# Patient Record
Sex: Female | Born: 1975 | Race: Black or African American | Hispanic: No | Marital: Single | Smoking: Current every day smoker
Health system: Southern US, Community
[De-identification: ages and names within clinical notes are randomized; demographics above are authoritative.]

---

## 2008-12-17 ENCOUNTER — Emergency Department (HOSPITAL_COMMUNITY): Admission: EM | Admit: 2008-12-17 | Discharge: 2008-12-17 | Payer: Self-pay | Admitting: Family Medicine

## 2013-04-28 ENCOUNTER — Emergency Department (HOSPITAL_BASED_OUTPATIENT_CLINIC_OR_DEPARTMENT_OTHER)
Admission: EM | Admit: 2013-04-28 | Discharge: 2013-04-28 | Disposition: A | Discharge status: Home / Self Care | Payer: Self-pay | Attending: Emergency Medicine | Admitting: Emergency Medicine

## 2013-04-28 ENCOUNTER — Encounter (HOSPITAL_BASED_OUTPATIENT_CLINIC_OR_DEPARTMENT_OTHER): Payer: Self-pay | Admitting: Emergency Medicine

## 2013-04-28 ENCOUNTER — Emergency Department (HOSPITAL_BASED_OUTPATIENT_CLINIC_OR_DEPARTMENT_OTHER): Payer: Self-pay

## 2013-04-28 DIAGNOSIS — F17200 Nicotine dependence, unspecified, uncomplicated: Secondary | ICD-10-CM | POA: Insufficient documentation

## 2013-04-28 DIAGNOSIS — M549 Dorsalgia, unspecified: Secondary | ICD-10-CM | POA: Insufficient documentation

## 2013-04-28 MED ORDER — HYDROCODONE-ACETAMINOPHEN 5-325 MG PO TABS: 2.0000 | ORAL_TABLET | ORAL | Status: AC | PRN

## 2013-04-28 MED ORDER — METHOCARBAMOL 500 MG PO TABS: 500.0000 mg | ORAL_TABLET | Freq: Two times a day (BID) | ORAL | Status: AC

## 2013-04-28 MED ORDER — IBUPROFEN 800 MG PO TABS: 800.0000 mg | ORAL_TABLET | Freq: Three times a day (TID) | ORAL | Status: AC

## 2013-04-28 NOTE — ED Notes (Signed)
Patient asked to change into gown. 

## 2013-04-28 NOTE — ED Provider Notes (Signed)
CSN: [id number]     Arrival date & time 04/28/13  1618 History   First MD Initiated Contact with Patient 04/28/13 1820     Chief Complaint  Patient presents with  . Back Pain     (Consider location/radiation/quality/duration/timing/severity/associated sxs/prior Treatment) Patient is a 38 y.o. female presenting with back pain. The history is provided by the patient. No language interpreter was used.  Back Pain Location:  Thoracic spine Quality:  Aching Radiates to:  Does not radiate Pain severity:  Moderate Pain is:  Worse during the day Progression:  Worsening Chronicity:  New Relieved by:  Nothing Ineffective treatments:  None tried   History reviewed. No pertinent past medical history. History reviewed. No pertinent past surgical history. History reviewed. No pertinent family history. History  Substance Use Topics  . Smoking status: Current Every Day Smoker -- 0.50 packs/day    Types: Cigarettes  . Smokeless tobacco: Not on file  . Alcohol Use: No   OB History   Grav Para Term Preterm Abortions TAB SAB Ect Mult Living                 Review of Systems  Musculoskeletal: Positive for back pain.  All other systems reviewed and are negative.     Allergies  Review of patient's allergies indicates no known allergies.  Home Medications   Prior to Admission medications   Not on File   BP 167/94  Pulse 85  Temp(Src) 98.6 F (37 C) (Oral)  Resp 18  Ht 5\' 10"  (1.778 m)  Wt 200 lb (90.719 kg)  BMI 28.70 kg/m2  SpO2 100%  LMP 04/17/2013 Physical Exam  Nursing note and vitals reviewed. Constitutional: She is oriented to person, place, and time. She appears well-developed and well-nourished.  HENT:  Head: Normocephalic and atraumatic.  Right Ear: External ear normal.  Left Ear: External ear normal.  Nose: Nose normal.  Eyes: EOM are normal. Pupils are equal, round, and reactive to light.  Neck: Normal range of motion. Neck supple.  Cardiovascular: Normal  rate.   Pulmonary/Chest: Effort normal.  Abdominal: Soft. She exhibits no distension.  Musculoskeletal: Normal range of motion.  Neurological: She is alert and oriented to person, place, and time.  Psychiatric: She has a normal mood and affect.    ED Course  Procedures (including critical care time) Labs Review Labs Reviewed - No data to display  Imaging Review Dg Chest 2 View  04/28/2013   CLINICAL DATA:  Right-sided back pain do.  EXAM: CHEST  2 VIEW  COMPARISON:  None.  FINDINGS: The heart size and mediastinal contours are within normal limits. Both lungs are clear. Mild lower thoracic spondylosis. .  IMPRESSION: 1. Mild lower thoracic spondylosis.  No acute findings.   Electronically Signed   By: Herbie BaltimoreWalt  Liebkemann M.D.   On: 04/28/2013 18:56     EKG Interpretation None      MDM   Final diagnoses:  Back pain    Hydrocodone Ibuprofen Robaxin See Dr. Pearletha ForgeHudnall for recheck in 1 week if pain persist    Elson AreasLeslie K Shyam Dawson, PA-C 04/28/13 1907  Lonia SkinnerLeslie K GreensboroSofia, [place] JerseyPA-C 04/28/13 Windell [id number]

## 2013-04-28 NOTE — Discharge Instructions (Signed)
Back Pain, Adult Low back pain is very common. About 1 in 5 people have back pain.The cause of low back pain is rarely dangerous. The pain often gets better over time.About half of people with a sudden onset of back pain feel better in just 2 weeks. About 8 in 10 people feel better by 6 weeks.  CAUSES Some common causes of back pain include:  Strain of the muscles or ligaments supporting the spine.  Wear and tear (degeneration) of the spinal discs.  Arthritis.  Direct injury to the back. DIAGNOSIS Most of the time, the direct cause of low back pain is not known.However, back pain can be treated effectively even when the exact cause of the pain is unknown.Answering your caregiver's questions about your overall health and symptoms is one of the most accurate ways to make sure the cause of your pain is not dangerous. If your caregiver needs more information, he or she may order lab work or imaging tests (X-rays or MRIs).However, even if imaging tests show changes in your back, this usually does not require surgery. HOME CARE INSTRUCTIONS For many people, back pain returns.Since low back pain is rarely dangerous, it is often a condition that people can learn to manageon their own.   Remain active. It is stressful on the back to sit or stand in one place. Do not sit, drive, or stand in one place for more than 30 minutes at a time. Take short walks on level surfaces as soon as pain allows.Try to increase the length of time you walk each day.  Do not stay in bed.Resting more than 1 or 2 days can delay your recovery.  Do not avoid exercise or work.Your body is made to move.It is not dangerous to be active, even though your back may hurt.Your back will likely heal faster if you return to being active before your pain is gone.  Pay attention to your body when you bend and lift. Many people have less discomfortwhen lifting if they bend their knees, keep the load close to their bodies,and  avoid twisting. Often, the most comfortable positions are those that put less stress on your recovering back.  Find a comfortable position to sleep. Use a firm mattress and lie on your side with your knees slightly bent. If you lie on your back, put a pillow under your knees.  Only take over-the-counter or prescription medicines as directed by your caregiver. Over-the-counter medicines to reduce pain and inflammation are often the most helpful.Your caregiver may prescribe muscle relaxant drugs.These medicines help dull your pain so you can more quickly return to your normal activities and healthy exercise.  Put ice on the injured area.  Put ice in a plastic bag.  Place a towel between your skin and the bag.  Leave the ice on for 15-20 minutes, 03-04 times a day for the first 2 to 3 days. After that, ice and heat may be alternated to reduce pain and spasms.  Ask your caregiver about trying back exercises and gentle massage. This may be of some benefit.  Avoid feeling anxious or stressed.Stress increases muscle tension and can worsen back pain.It is important to recognize when you are anxious or stressed and learn ways to manage it.Exercise is a great option. SEEK MEDICAL CARE IF:  You have pain that is not relieved with rest or medicine.  You have pain that does not improve in 1 week.  You have new symptoms.  You are generally not feeling well. SEEK   IMMEDIATE MEDICAL CARE IF:   You have pain that radiates from your back into your legs.  You develop new bowel or bladder control problems.  You have unusual weakness or numbness in your arms or legs.  You develop nausea or vomiting.  You develop abdominal pain.  You feel faint. Document Released: 01/01/2005 Document Revised: 07/03/2011 Document Reviewed: 05/22/2010 [employer] Patient Information 2014 [employer].  

## 2013-04-28 NOTE — ED Notes (Signed)
Pt reports (R) back pain since moving a week ago.  Increased pain with movement

## 2013-04-29 NOTE — ED Provider Notes (Signed)
Medical screening examination/treatment/procedure(s) were performed by non-physician practitioner and as supervising physician I was immediately available for consultation/collaboration.   Takiesha Mcdevitt E Dashton Czerwinski, MD 04/29/13 1121 

## 2013-05-05 ENCOUNTER — Ambulatory Visit: Payer: Self-pay | Admitting: Family Medicine

## 2015-11-22 IMAGING — CR DG CHEST 2V
2 series · 2 of 2 positions shown · non-contrast
Comparison: None.

CLINICAL DATA: Right-sided back pain do.

EXAM:
CHEST  2 VIEW

[w chest pa]
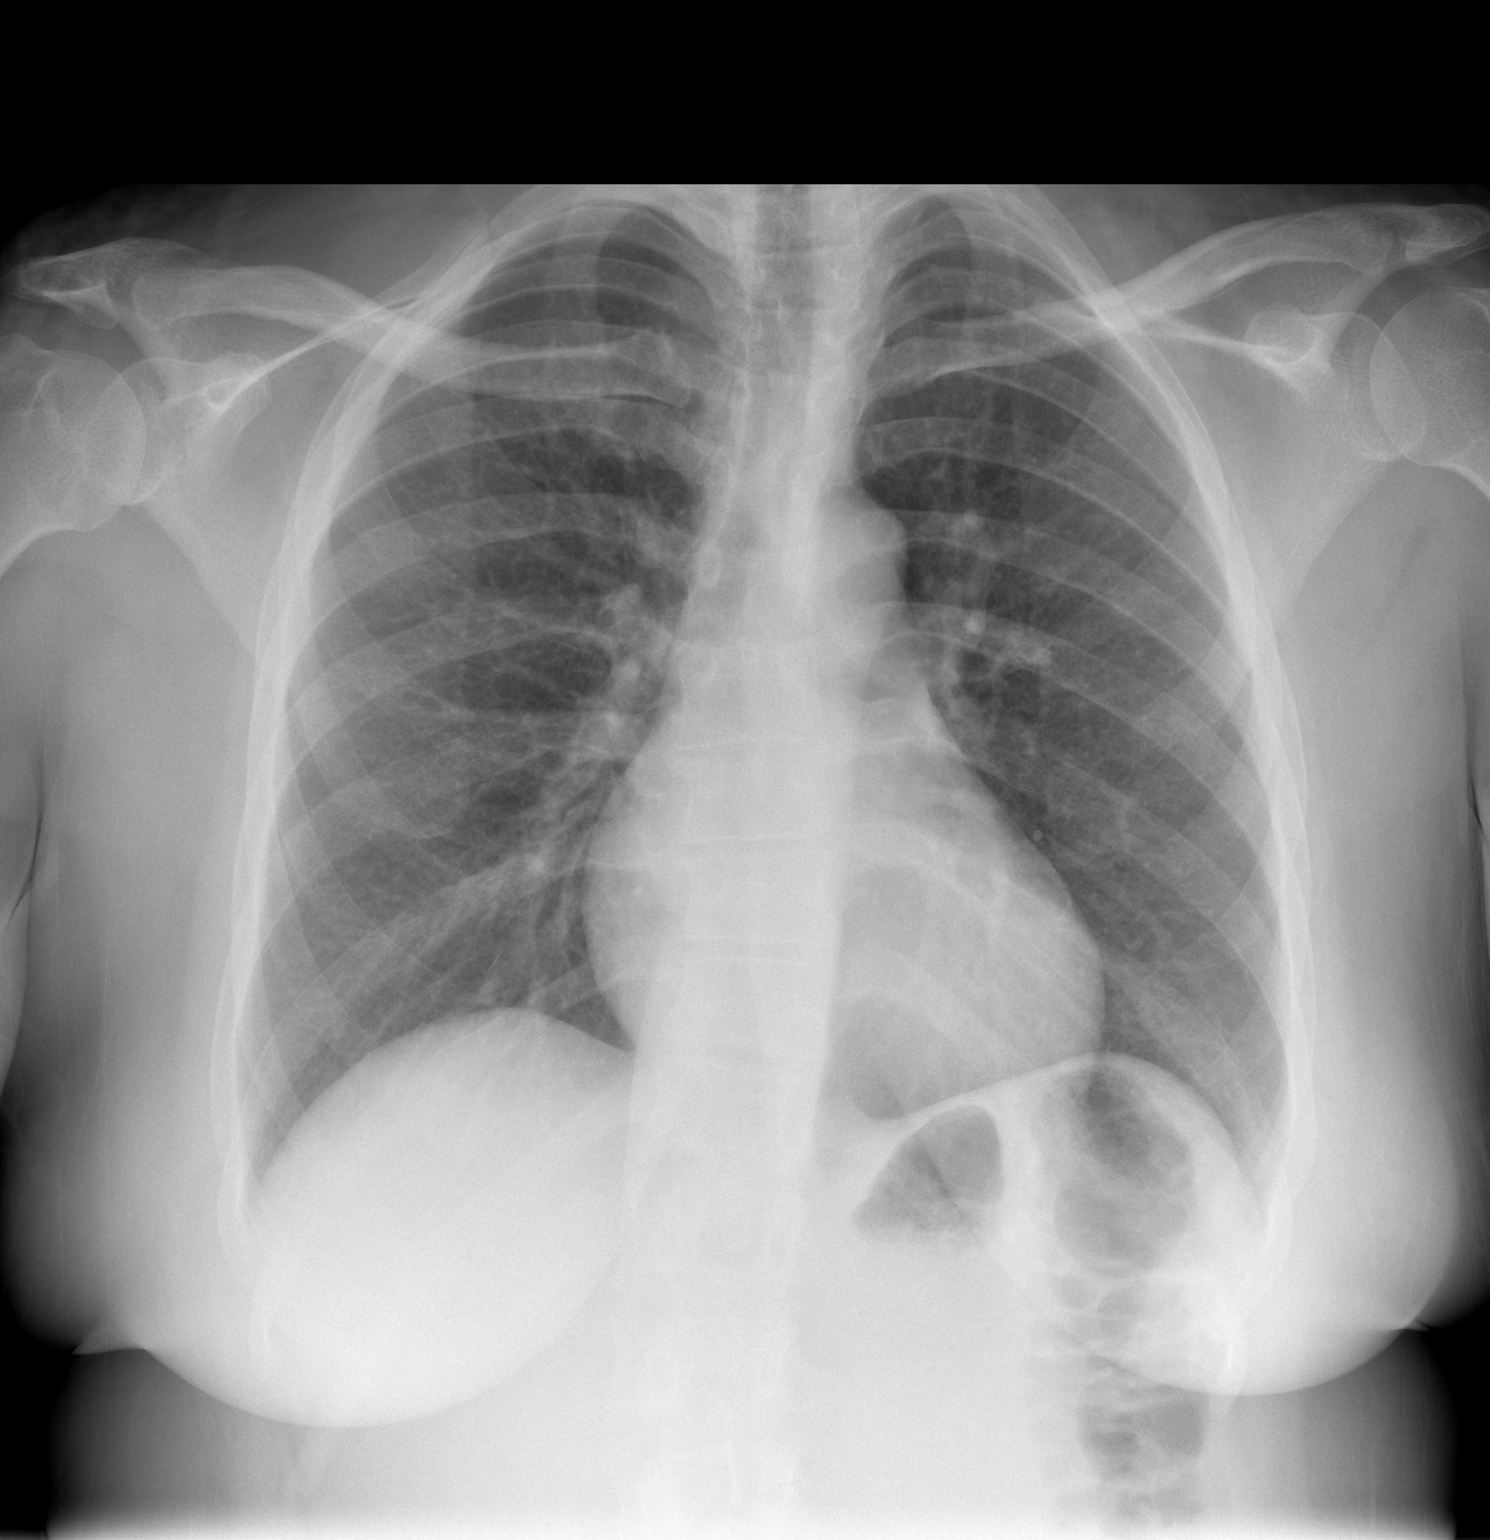

[w chest lat]
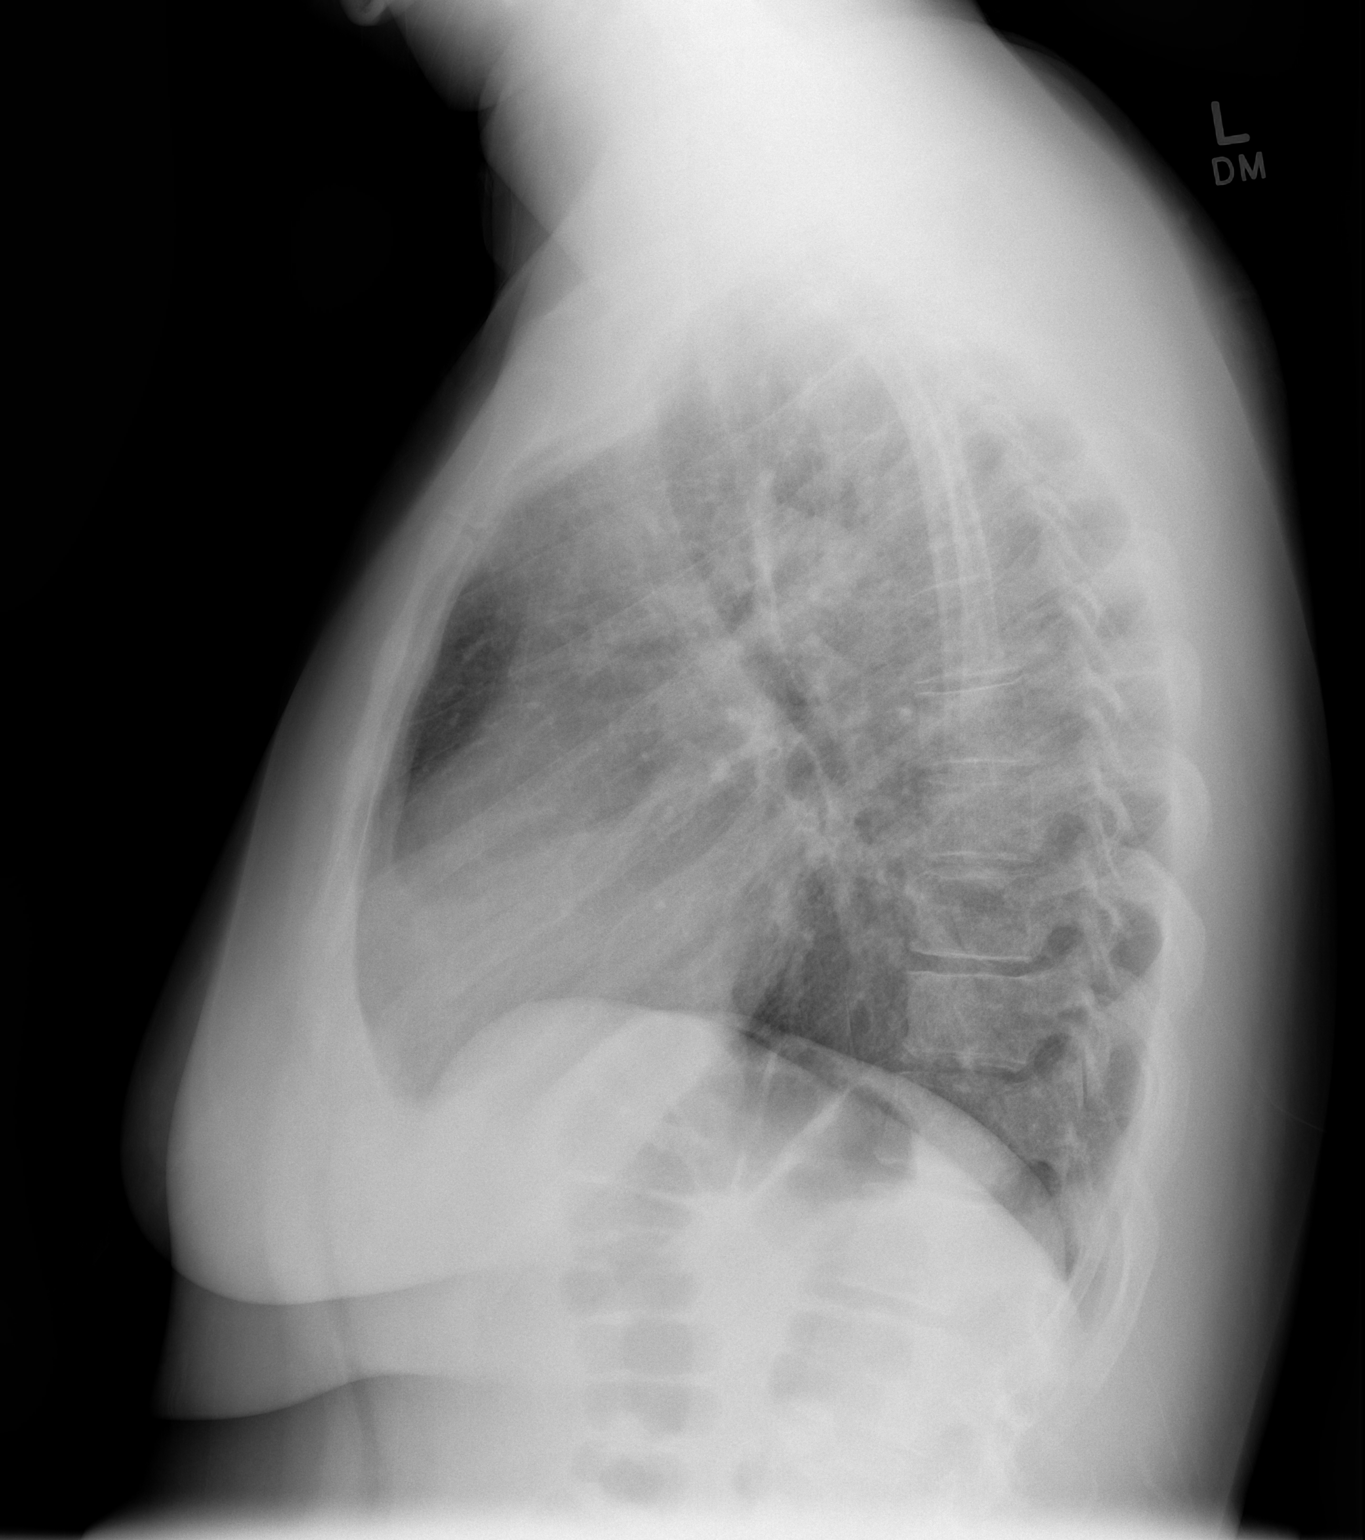

[2 of 2 positions shown; findings below may reference images not displayed]

FINDINGS: The heart size and mediastinal contours are within normal limits.
Both lungs are clear. Mild lower thoracic spondylosis. .
IMPRESSION: 1. Mild lower thoracic spondylosis.  No acute findings.
